# Patient Record
Sex: Male | Born: 1995 | Race: Black or African American | Hispanic: No | Marital: Single | Smoking: Never smoker
Health system: Southern US, Community
[De-identification: ages and names within clinical notes are randomized; demographics above are authoritative.]

---

## 2015-06-20 ENCOUNTER — Encounter (HOSPITAL_COMMUNITY): Payer: Self-pay | Admitting: Emergency Medicine

## 2015-06-20 ENCOUNTER — Emergency Department (HOSPITAL_COMMUNITY)
Admission: EM | Admit: 2015-06-20 | Discharge: 2015-06-20 | Disposition: A | Discharge status: Home / Self Care | Payer: No Typology Code available for payment source | Attending: Emergency Medicine | Admitting: Emergency Medicine

## 2015-06-20 ENCOUNTER — Emergency Department (HOSPITAL_COMMUNITY): Payer: No Typology Code available for payment source

## 2015-06-20 DIAGNOSIS — M549 Dorsalgia, unspecified: Secondary | ICD-10-CM

## 2015-06-20 DIAGNOSIS — Y92410 Unspecified street and highway as the place of occurrence of the external cause: Secondary | ICD-10-CM | POA: Diagnosis not present

## 2015-06-20 DIAGNOSIS — Y998 Other external cause status: Secondary | ICD-10-CM | POA: Insufficient documentation

## 2015-06-20 DIAGNOSIS — R200 Anesthesia of skin: Secondary | ICD-10-CM | POA: Insufficient documentation

## 2015-06-20 DIAGNOSIS — Y9389 Activity, other specified: Secondary | ICD-10-CM | POA: Insufficient documentation

## 2015-06-20 DIAGNOSIS — S3992XA Unspecified injury of lower back, initial encounter: Secondary | ICD-10-CM | POA: Diagnosis present

## 2015-06-20 DIAGNOSIS — R202 Paresthesia of skin: Secondary | ICD-10-CM | POA: Diagnosis not present

## 2015-06-20 LAB — URINALYSIS, ROUTINE W REFLEX MICROSCOPIC
Bilirubin Urine: NEGATIVE
Glucose, UA: NEGATIVE mg/dL
Hgb urine dipstick: NEGATIVE
Ketones, ur: NEGATIVE mg/dL
Leukocytes, UA: NEGATIVE
Nitrite: NEGATIVE
Protein, ur: NEGATIVE mg/dL
Specific Gravity, Urine: 1.02 (ref 1.005–1.030)
pH: 6.5 (ref 5.0–8.0)

## 2015-06-20 LAB — CBC WITH DIFFERENTIAL/PLATELET
Basophils Absolute: 0 K/uL (ref 0.0–0.1)
Basophils Relative: 0 %
Eosinophils Absolute: 0 K/uL (ref 0.0–0.7)
Eosinophils Relative: 1 %
HCT: 40.6 % (ref 39.0–52.0)
Hemoglobin: 13.6 g/dL (ref 13.0–17.0)
Lymphocytes Relative: 37 %
Lymphs Abs: 2.4 K/uL (ref 0.7–4.0)
MCH: 29.8 pg (ref 26.0–34.0)
MCHC: 33.5 g/dL (ref 30.0–36.0)
MCV: 89 fL (ref 78.0–100.0)
Monocytes Absolute: 0.3 K/uL (ref 0.1–1.0)
Monocytes Relative: 5 %
Neutro Abs: 3.7 K/uL (ref 1.7–7.7)
Neutrophils Relative %: 57 %
Platelets: 270 K/uL (ref 150–400)
RBC: 4.56 MIL/uL (ref 4.22–5.81)
RDW: 12.2 % (ref 11.5–15.5)
WBC: 6.4 K/uL (ref 4.0–10.5)

## 2015-06-20 LAB — BASIC METABOLIC PANEL WITH GFR
Anion gap: 9 (ref 5–15)
BUN: 15 mg/dL (ref 6–20)
CO2: 25 mmol/L (ref 22–32)
Calcium: 9.4 mg/dL (ref 8.9–10.3)
Chloride: 109 mmol/L (ref 101–111)
Creatinine, Ser: 0.99 mg/dL (ref 0.61–1.24)
GFR calc Af Amer: >60 mL/min
GFR calc non Af Amer: >60 mL/min
Glucose, Bld: 101 mg/dL — ABNORMAL HIGH (ref 65–99)
Potassium: 3.3 mmol/L — ABNORMAL LOW (ref 3.5–5.1)
Sodium: 143 mmol/L (ref 135–145)

## 2015-06-20 MED ORDER — CYCLOBENZAPRINE HCL 5 MG PO TABS: 5.0000 mg | ORAL_TABLET | Freq: Two times a day (BID) | ORAL | Status: AC | PRN

## 2015-06-20 MED ORDER — NAPROXEN 500 MG PO TABS: 500.0000 mg | ORAL_TABLET | Freq: Two times a day (BID) | ORAL | Status: AC
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
## 2015-06-20 NOTE — ED Notes (Signed)
Patient transported to X-ray 
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
